# Patient Record
Sex: Male | Born: 1935 | Race: White | Marital: Married | Smoking: Former smoker
Health system: Southern US, Community
[De-identification: ages and names within clinical notes are randomized; demographics above are authoritative.]

## PROBLEM LIST (undated history)

## (undated) DIAGNOSIS — I4892 Unspecified atrial flutter: Secondary | ICD-10-CM

## (undated) DIAGNOSIS — G309 Alzheimer's disease, unspecified: Secondary | ICD-10-CM

## (undated) DIAGNOSIS — F0280 Dementia in other diseases classified elsewhere without behavioral disturbance: Secondary | ICD-10-CM

## (undated) DIAGNOSIS — N189 Chronic kidney disease, unspecified: Secondary | ICD-10-CM

## (undated) DIAGNOSIS — I10 Essential (primary) hypertension: Secondary | ICD-10-CM

## (undated) HISTORY — DX: Essential (primary) hypertension: I10

## (undated) HISTORY — DX: Unspecified atrial flutter: I48.92

## (undated) HISTORY — DX: Chronic kidney disease, unspecified: N18.9

## (undated) HISTORY — DX: Alzheimer's disease, unspecified: G30.9

## (undated) HISTORY — DX: Dementia in other diseases classified elsewhere, unspecified severity, without behavioral disturbance, psychotic disturbance, mood disturbance, and anxiety: F02.80

---

## 2011-03-19 ENCOUNTER — Other Ambulatory Visit: Payer: Self-pay | Admitting: Orthopedic Surgery

## 2011-03-19 ENCOUNTER — Encounter (HOSPITAL_COMMUNITY): Payer: Medicare Other

## 2011-03-19 ENCOUNTER — Ambulatory Visit (HOSPITAL_COMMUNITY)
Admission: RE | Admit: 2011-03-19 | Discharge: 2011-03-19 | Disposition: A | Discharge status: Home / Self Care | Payer: Medicare Other | Source: Ambulatory Visit | Attending: Orthopedic Surgery | Admitting: Orthopedic Surgery

## 2011-03-19 ENCOUNTER — Other Ambulatory Visit (HOSPITAL_COMMUNITY): Payer: Self-pay | Admitting: Orthopedic Surgery

## 2011-03-19 DIAGNOSIS — Z01812 Encounter for preprocedural laboratory examination: Secondary | ICD-10-CM | POA: Insufficient documentation

## 2011-03-19 DIAGNOSIS — Z01818 Encounter for other preprocedural examination: Secondary | ICD-10-CM

## 2011-03-19 DIAGNOSIS — Z01810 Encounter for preprocedural cardiovascular examination: Secondary | ICD-10-CM | POA: Insufficient documentation

## 2011-03-19 LAB — CBC
HCT: 42.5 % (ref 39.0–52.0)
MCH: 31.6 pg (ref 26.0–34.0)
MCHC: 33.6 g/dL (ref 30.0–36.0)
MCV: 93.8 fL (ref 78.0–100.0)
RDW: 12.7 % (ref 11.5–15.5)

## 2011-03-19 LAB — URINALYSIS, ROUTINE W REFLEX MICROSCOPIC
Bilirubin Urine: NEGATIVE
Glucose, UA: >1000 mg/dL — AB
Ketones, ur: NEGATIVE mg/dL
Leukocytes, UA: NEGATIVE
pH: 6 (ref 5.0–8.0)

## 2011-03-19 LAB — BASIC METABOLIC PANEL
BUN: 32 mg/dL — ABNORMAL HIGH (ref 6–23)
Chloride: 102 mEq/L (ref 96–112)
GFR calc Af Amer: 44 mL/min — ABNORMAL LOW (ref >60)
Glucose, Bld: 268 mg/dL — ABNORMAL HIGH (ref 70–99)
Potassium: 5.1 mEq/L (ref 3.5–5.1)
Sodium: 138 mEq/L (ref 135–145)

## 2011-03-19 LAB — DIFFERENTIAL
Basophils Absolute: 0.1 10*3/uL (ref 0.0–0.1)
Basophils Relative: 1 % (ref 0–1)
Eosinophils Relative: 1 % (ref 0–5)
Monocytes Absolute: 0.8 10*3/uL (ref 0.1–1.0)

## 2011-03-19 LAB — SURGICAL PCR SCREEN: MRSA, PCR: NEGATIVE

## 2011-03-21 ENCOUNTER — Ambulatory Visit (INDEPENDENT_AMBULATORY_CARE_PROVIDER_SITE_OTHER): Payer: Medicare Other | Admitting: Cardiovascular Disease

## 2011-03-21 ENCOUNTER — Encounter: Payer: Self-pay | Admitting: Cardiovascular Disease

## 2011-03-21 DIAGNOSIS — I4892 Unspecified atrial flutter: Secondary | ICD-10-CM | POA: Insufficient documentation

## 2011-03-21 DIAGNOSIS — Z01810 Encounter for preprocedural cardiovascular examination: Secondary | ICD-10-CM | POA: Insufficient documentation

## 2011-03-21 NOTE — Assessment & Plan Note (Signed)
The patient is going to have hip replacement done next week. The patient wants this to be done for quality of life and regardless of his cardiovascular risk. This was clear after discussing with him. Obviously, with his age, chronic atrial flutter and bradycardia without any recent cardiac evaluation, he would be considered at moderate risk for cardiovascular complications. I don't see an absolute contraindication to proceed with the surgery nonetheless. The patient has been in atrial flutter since at least 2005 with chronic bradycardia. He has tolerated this and it has not progressed over the last 7 years. Aggressive DVT prophylaxis is recommended postsurgery.

## 2011-03-21 NOTE — Patient Instructions (Signed)
Your physician wants you to follow-up in: 3 months. You will receive a reminder letter in the mail one-two months in advance. If you don't receive a letter, please call our office to schedule the follow-up appointment. Your physician has requested that you have an echocardiogram. Echocardiography is a painless test that uses sound waves to create images of your heart. It provides your doctor with information about the size and shape of your heart and how well your heart's chambers and valves are working. This procedure takes approximately one hour. There are no restrictions for this procedure. DO IN 3 MONTHS BEFORE FOLLOW UP APPOINTMENT WITH DR. Kirke Corin.  Your physician recommends that you continue on your current medications as directed. Please refer to the Current Medication list given to you today.

## 2011-03-21 NOTE — Assessment & Plan Note (Signed)
The patient has chronic atrial flutter with slow ventricular response. I recommend avoiding any medication that can slow the AV conduction such as beta blockers or calcium channel blockers. I recommend an echocardiogram as well as long-term anticoagulation if there is no contraindication. The patient wants to address this after his hip surgery. I will schedule him for followup in 3 months. The patient should continue to be on aspirin 81 mg once daily in the meanwhile.

## 2011-03-21 NOTE — Progress Notes (Signed)
HPI  This is an 77 year old male who is here today for preoperative cardiovascular evaluation for a planned hip replacement next week. The patient was found to have atrial flutter with slow ventricular response and was thus referred to [place] for further evaluation. I looked back in his medical record. The patient was found to be in atrial flutter with 5-1 conduction dating back to 2005 with a ventricular rate of 39 beats per minute at that time. It appears that anticoagulation was discussed with him but he declined. The patient has been asymptomatic from this. He denies any dizziness, syncope or presyncope. According to him his heart rate has always been low. He use to be an avid runner and biker when he was younger. He participated in many marathons. He does not have any previous history of myocardial infarction. His functional capacity was very good up until recently when he started having problems with his hip. He did have an echocardiogram done in 2005 which showed normal LV systolic function and no significant valvular abnormalities. The patient is very hard of hearing and it was difficult to communicate with him.  No Known Allergies   No current outpatient prescriptions on file prior to visit.     Past Medical History  Diagnosis Date  . Hypertension   . CKD (chronic kidney disease)   . Diabetes mellitus   . Atrial flutter      History reviewed. No pertinent past surgical history.   History reviewed. No pertinent family history.   History   Social History  . Marital Status: Married    Spouse Name: N/A    Number of Children: N/A  . Years of Education: N/A   Occupational History  . Not on file.   Social History Main Topics  . Smoking status: Former Smoker    Quit date: 06/23/1970  . Smokeless tobacco: Not on file  . Alcohol Use: Not on file  . Drug Use: Not on file  . Sexually Active: Not on file   Other Topics Concern  . Not on file   Social History Narrative  . No  narrative on file     ROS Constitutional: Negative for fever, chills, diaphoresis, activity change, appetite change and fatigue.  HENT: Negative for  nosebleeds, congestion, sore throat, facial swelling, drooling, trouble swallowing, neck pain, voice change, sinus pressure and tinnitus.  Eyes: Negative for photophobia, pain, discharge and visual disturbance.  Respiratory: Negative for apnea, cough, chest tightness, shortness of breath and wheezing.  Cardiovascular: Negative for chest pain, palpitations and leg swelling.  Gastrointestinal: Negative for nausea, vomiting, abdominal pain, diarrhea, constipation, blood in stool and abdominal distention.  Genitourinary: Negative for dysuria, urgency, frequency, hematuria and decreased urine volume.  Musculoskeletal: Negative for myalgias, joint swelling,  and gait problem.  Skin: Negative for color change, pallor, rash and wound.  Neurological: Negative for dizziness, tremors, seizures, syncope, speech difficulty, weakness, light-headedness, numbness and headaches.  Psychiatric/Behavioral: Negative for suicidal ideas, hallucinations, behavioral problems and agitation. The patient is not nervous/anxious.     PHYSICAL EXAM   BP 159/72  Pulse 50  Ht 5\' 9"  (1.753 m)  Wt 180 lb 1.9 oz (81.702 kg)  BMI 26.60 kg/m2  Constitutional: He is oriented to person, place, and time. He appears well-developed and well-nourished. No distress.  HENT: No nasal discharge.  Head: Normocephalic and atraumatic.  Eyes: Pupils are equal, round, and reactive to light. Right eye exhibits no discharge. Left eye exhibits no discharge.  Neck: Normal range of  motion. Neck supple. No JVD present. No thyromegaly present.  Cardiovascular: Normal rate, irregular rhythm, normal heart sounds and intact distal pulses. Exam reveals no gallop and no friction rub.  No murmur heard.  Pulmonary/Chest: Effort normal and breath sounds normal. No stridor. No respiratory distress. He  has no wheezes. He has no rales. He exhibits no tenderness.  Abdominal: Soft. Bowel sounds are normal. He exhibits no distension. There is no tenderness. There is no rebound and no guarding.  Musculoskeletal: Normal range of motion. He exhibits no edema and no tenderness.  Neurological: He is alert and oriented to person, place, and time. Coordination normal.  Skin: Skin is warm and dry. No rash noted. He is not diaphoretic. No erythema. No pallor.  Psychiatric: He has a normal mood and affect. His behavior is normal. Judgment and thought content normal.       EKG: His recent ECG was reviewed which showed atrial flutter with 5-1 conduction. He does have T wave inversion in the lateral leads suggestive of ischemia.   ASSESSMENT AND PLAN

## 2011-03-25 ENCOUNTER — Inpatient Hospital Stay (HOSPITAL_COMMUNITY): Payer: Medicare Other

## 2011-03-25 ENCOUNTER — Inpatient Hospital Stay (HOSPITAL_COMMUNITY)
Admission: RE | Admit: 2011-03-25 | Discharge: 2011-03-27 | DRG: 470 | Disposition: A | Discharge status: Home Health | Payer: Medicare Other | Source: Ambulatory Visit | Attending: Orthopedic Surgery | Admitting: Orthopedic Surgery

## 2011-03-25 DIAGNOSIS — Z79899 Other long term (current) drug therapy: Secondary | ICD-10-CM

## 2011-03-25 DIAGNOSIS — I4892 Unspecified atrial flutter: Secondary | ICD-10-CM | POA: Diagnosis present

## 2011-03-25 DIAGNOSIS — M169 Osteoarthritis of hip, unspecified: Principal | ICD-10-CM | POA: Diagnosis present

## 2011-03-25 DIAGNOSIS — M1610 Unilateral primary osteoarthritis, unspecified hip: Principal | ICD-10-CM | POA: Diagnosis present

## 2011-03-25 DIAGNOSIS — Z01810 Encounter for preprocedural cardiovascular examination: Secondary | ICD-10-CM

## 2011-03-25 DIAGNOSIS — E119 Type 2 diabetes mellitus without complications: Secondary | ICD-10-CM | POA: Diagnosis present

## 2011-03-25 DIAGNOSIS — I498 Other specified cardiac arrhythmias: Secondary | ICD-10-CM | POA: Diagnosis present

## 2011-03-25 DIAGNOSIS — Z01812 Encounter for preprocedural laboratory examination: Secondary | ICD-10-CM

## 2011-03-25 LAB — TYPE AND SCREEN
ABO/RH(D): A POS
Antibody Screen: NEGATIVE

## 2011-03-25 LAB — GLUCOSE, CAPILLARY
Glucose-Capillary: 156 mg/dL — ABNORMAL HIGH (ref 70–99)
Glucose-Capillary: 197 mg/dL — ABNORMAL HIGH (ref 70–99)
Glucose-Capillary: 194 mg/dL — ABNORMAL HIGH (ref 70–99)

## 2011-03-26 LAB — BASIC METABOLIC PANEL
CO2: 24 mEq/L (ref 19–32)
Chloride: 105 mEq/L (ref 96–112)
GFR calc Af Amer: 45 mL/min — ABNORMAL LOW (ref >90)
Potassium: 4.2 mEq/L (ref 3.5–5.1)
Sodium: 138 mEq/L (ref 135–145)

## 2011-03-26 LAB — CBC
Platelets: 187 10*3/uL (ref 150–400)
RBC: 3.58 MIL/uL — ABNORMAL LOW (ref 4.22–5.81)
RDW: 12.5 % (ref 11.5–15.5)
WBC: 11.3 10*3/uL — ABNORMAL HIGH (ref 4.0–10.5)

## 2011-03-26 LAB — GLUCOSE, CAPILLARY
Glucose-Capillary: 250 mg/dL — ABNORMAL HIGH (ref 70–99)
Glucose-Capillary: 194 mg/dL — ABNORMAL HIGH (ref 70–99)

## 2011-03-27 LAB — GLUCOSE, CAPILLARY: Glucose-Capillary: 217 mg/dL — ABNORMAL HIGH (ref 70–99)

## 2011-03-27 LAB — CBC
MCH: 31.5 pg (ref 26.0–34.0)
MCHC: 33.9 g/dL (ref 30.0–36.0)
RDW: 12.4 % (ref 11.5–15.5)

## 2011-03-27 LAB — BASIC METABOLIC PANEL
BUN: 21 mg/dL (ref 6–23)
Calcium: 8.7 mg/dL (ref 8.4–10.5)
GFR calc Af Amer: 45 mL/min — ABNORMAL LOW (ref >90)
GFR calc non Af Amer: 39 mL/min — ABNORMAL LOW (ref >90)
Glucose, Bld: 158 mg/dL — ABNORMAL HIGH (ref 70–99)
Potassium: 3.6 mEq/L (ref 3.5–5.1)
Sodium: 139 mEq/L (ref 135–145)

## 2011-03-27 NOTE — H&P (Signed)
  NAME:  Eric Lang, NANNINI NO.:  [id number]  MEDICAL RECORD NO.:  [id number]  LOCATION:                                 FACILITY:  PHYSICIAN:  Madlyn Frankel. Charlann Boxer, M.D.  DATE OF BIRTH:  1923-08-29  DATE OF ADMISSION: DATE OF DISCHARGE:                             HISTORY & PHYSICAL   ADMISSION DIAGNOSIS:  Right hip degenerative arthritis.  HISTORY OF PRESENT ILLNESS:  This is an 77 year old gentleman with history of osteoarthritis of his right hip which has failed conservative treatment to alleviate his discomfort and after discussion of treatment, benefits, risks, and options, the patient now scheduled for total hip arthroplasty by anterior approach.  Note that his medical doctor is Dr. Sherril Croon.  He will be going home after surgery.  He does have chronic bradycardia and Dr. Sherril Croon recommended telemetry bed postoperatively to monitor his bradycardia.  He is given his postop medications of aspirin, Robaxin, iron, MiraLax, and Colace to take postoperatively and he is a candidate for tranexamic acid and will receive that in preop.  PAST MEDICAL HISTORY:  Drug allergies none.  CURRENT MEDICATIONS:  Glyburide and metformin 5/500 one daily.  SERIOUS MEDICAL ILLNESSES:  Diabetes.  PREVIOUS SURGERIES:  Cataracts in both eyes and kidney stone surgery.  FAMILY HISTORY:  Positive for death at older age, cause unknown in father and mother.  SOCIAL HISTORY:  The patient is married.  He used to be a Agricultural consultant.  He is an avid runner until 6 years ago when he now walks several miles a day.  He does not smoke and does not drink.  REVIEW OF SYSTEMS:  CENTRAL NERVOUS SYSTEM:  Negative for headache, blurred vision, or dizziness.  Positive for severe hearing loss. PULMONARY:  Negative for shortness breath, PND, or orthopnea. CARDIOVASCULAR:  Positive for chronic bradycardia secondary to his exercise regimen.  GI: Negative for ulcers, hepatitis.  GU: Positive  for nocturia.  MUSCULOSKELETAL:  Positive as in HPI.  PHYSICAL EXAMINATION:  VITAL SIGNS:  BP 140/82, respirations 12, and pulse 48 and regular. GENERAL APPEARANCE:  This is a well-nourished and well-nourished gentleman, in no acute distress. HEENT:  Head normocephalic.  Nose patent.  Ears with a hearing aid in the left ear.  The patient is nearly deaf. NECK:  Supple without adenopathy.  Throat without injection.  Carotids 2+ without bruit.  Chest: Clear to auscultation.  No rales or rhonchi. Respirations 12 HEART:  Regular rate and rhythm at 48 beats without murmur. ABDOMEN:  Soft with active bowel sounds.  No masses, organomegaly. Neurologic: Patient alert and oriented to time, place and person. Cranial nerves II-XII grossly intact.  EXTREMITIES:  Shows the right hip with decreased range of motion with pain.  Neurovascular status intact.  IMPRESSION:  His right hip osteoarthritis.  PLAN:  Total hip arthroplasty by anterior approach, right hip.     Jaquelyn Bitter. Chabon, P.A.   ______________________________ Madlyn Frankel Charlann Boxer, M.D.    SJC/MEDQ  D:  03/19/2011  T:  03/19/2011  Job:  [id number]  Electronically Signed by Jodene Nam P.A. on 03/24/2011 12:59:47 PM Electronically Signed by Durene Romans M.D. on 03/27/2011 08:17:22 AM

## 2011-03-31 NOTE — Op Note (Signed)
NAMEMarland Lang  DEVRON, COHICK NO.:  [id number]  MEDICAL RECORD NO.:  [id number]  LOCATION:  1408                         FACILITY:  [facility]  PHYSICIAN:  Madlyn Frankel. Charlann Boxer, M.D.  DATE OF BIRTH:  12/09/1923  DATE OF PROCEDURE:  03/25/2011 DATE OF DISCHARGE:                              OPERATIVE REPORT   PREOPERATIVE DIAGNOSIS:  Advanced right hip osteoarthritis.  POSTOPERATIVE DIAGNOSIS:  Advanced right hip osteoarthritis.  PROCEDURE:  Right total hip replacement through an anterior approach utilizing DePuy component size 58, Pinnacle cup 36 +4, neutral AltrX liner, size 4 standard Tri-Lock stem with 36 +1.5 metal A sphere ball.  SURGEON:  Madlyn Frankel. Charlann Boxer, M.D.  ASSISTANT:  Lanney Gins, PA-C.  Please note that Lanney Gins, physician assistant, was present for the entirety of the case from preoperative position to perioperative retractor management, general facilitation of case as well as primary wound closure at the end of case.  ANESTHESIA:  General.  SPECIMENS:  None.  COMPLICATIONS:  None.  DRAINS:  One Hemovac.  BLOOD LOSS:  About 450 cc.  INDICATIONS FOR PROCEDURE:  Eric Lang is a pleasant 77 year old gentleman who had presented to office for evaluation of right hip pain. Radiographs revealed end-stage degenerative changes.  He had reported significant loss in his functional quality of life.  Despite being 55, he is very active and relatively healthy.  He wished to remain active in terms of walking as this is a very important part of his life.  After reviewing risks and benefits of the procedure, he wished to proceed with an anterior approach.  Specific risk of infection, DVT, component failure, need for revision surgery, all discussed and reviewed as well as the approach and risks of dislocation.  Consent obtained.  PROCEDURE IN DETAIL:  The patient was brought to operative theater. Once adequate anesthesia, preoperative antibiotics, Ancef  administered, the patient was positioned supine on the OSI Hana table.  Once bony prominences were padded and after adequately positioning of the perineal post, his right arm was flexed over his body.  We pre-draped out the hip and used fluoroscopy to confirm orientation of the pelvis and rule out its positioning.  The right hip was then prepped and draped in sterile fashion with a shower curtain technique over the anterior aspect of the right hip. Time-out was performed identifying the patient, planned procedure, and extremity.  An incision was then made 2 cm distal and lateral to the anterior superior iliac spine and sharp dissection carried to the fascia of the tensor fascia lata muscle.  The fascia was then incised and the muscle belly swept laterally and retractor placed along the superior neck.  Following cauterization of the circumflex vessels and removing the pericapsular fat, retractors were placed inferiorly along the neck.  Retractor was then placed over the anterior acetabulum underneath the rectus and rectus femoris.  Capsulotomy was then made along the superior neck to the trochanteric fossa and along the inferior neck region.  Tag sutures were placed and retractor placed intracapsular.  Under fluoroscopic imaging, the confirmation of orientation of the neck cut was made and that was identified.  A neck osteotomy was made and  femoral head removed without difficulty.  Traction was utilized but removed at this point.  Retractors were placed anterior and superior labrum and soft tissue debrided.  I began reaming and reamed up to 57 reamer where I got a good bony bed preparation circumferentially.  Given this reaming, a 58 cup was chosen and impacted with a good secure rim fit.  Based on the initial fixation, no screws were utilized.  Hole eliminator was placed in the 36 +4 neutral site.  AltrX liner was positioned.  At this point, the retractors were removed and  attention was now directed to the femoral preparation.  The lateral femoral hook was placed and the femur elevated and held in position with a hook.  The hip was then extended and abducted and the posterior soft tissue debrided and a retractor placed posterior and medial retractor placed and the proximal femur was then opened, starting box osteotome, starting broach and then broached up to a size 4 broach.  Radiographic confirmation and orientation of stem in AP and lateral planes was carried out.  Trial reduction was carried out of standard neck.  At this point, I felt leg lengths were equal and hip stability was excellent. Given these findings, trial components removed and final 4 standard Tri- Lock stem was chosen.  It was then impacted down at the level of the neck cut.  Final 36 +1.5 metal A sphere ball was chosen and impacted onto clean and dry trunnion and the hip reduced.  The hip was cleaned, dried, and dressed sterilely, and irrigated throughout the case.  I reapproximated the anterior capsule using #1 Vicryl.  The fascia of tensor fascia lata muscle was then reapproximated using #1 Vicryl as well.  The hip was then cleaned, dried, and dressed sterilely using Dermabond and Aquacel dressing, drain site dressed separately.  The patient was then brought to recovery room in stable condition tolerating the procedure well.     Madlyn Frankel Charlann Boxer, M.D.     MDO/MEDQ  D:  03/27/2011  T:  03/27/2011  Job:  [id number]  Electronically Signed by Durene Romans M.D. on 03/31/2011 01:29:48 PM

## 2011-04-14 NOTE — Discharge Summary (Signed)
  NAMEMarland Kitchen  BROOKS, KINNAN NO.:  [id number]  MEDICAL RECORD NO.:  [id number]  LOCATION:  1408                         FACILITY:  [facility]  PHYSICIAN:  Madlyn Frankel. Charlann Boxer, M.D.  DATE OF BIRTH:  Dec 14, 1923  DATE OF ADMISSION:  03/25/2011 DATE OF DISCHARGE:  03/27/2011                              DISCHARGE SUMMARY   PROCEDURES:  Right total hip arthroplasty.  ADMITTING DIAGNOSIS:  Right hip degenerative arthritis.  DISCHARGE DIAGNOSES: 1. Status post right total hip arthroplasty. 2. Diabetes.  HISTORY OF PRESENT ILLNESS:  The patient is an 77 year old gentleman with a history of osteoarthritis of his right hip.  The patient has tried several conservative treatments, all of which have failed to alleviate his discomfort.  X-rays in the clinic did show degenerative arthritic changes of the right hip.  Various options were discussed with the patient.  The patient wished to proceed with surgery.  Risks, benefits, and expectations of the procedure discussed with the patient. The patient understands the risks, benefits, and expectations and wished to proceed with surgery.  HOSPITAL COURSE:  The patient underwent the above-stated procedure on March 25, 2011.  The patient tolerated the procedure well, was brought to the recovery room in good condition and subsequently to the floor.  Postop day #1, March 26, 2011, the patient is doing well.  No events. Afebrile.  Vital signs stable.  Hemovac was taken out.  He was distally neurovascularly intact.  Hematocrit was 33.6.  Postop day #2, March 27, 2011, the patient is doing well.  No events. Hematocrit is 33.0.  Right hip was clean, dry, and intact.  The patient had physical therapy x2.  The patient was felt to be doing well enough to be discharged home with home health PT after having physical therapy within the hospital.  DISCHARGE CONDITION:  Good.  DISCHARGE INSTRUCTIONS:  The patient will be discharged home with  home health PT after having physical therapy in the hospital.  The patient will be weightbearing as tolerated.  The patient will maintain surgical dressing for about 8 days, after which time we will replace with gauze and tape.  The patient should keep the area dry and clean until followup.  The patient will follow up with [facility] in 2 weeks.  The patient is to call with any questions or concerns.  DISCHARGE MEDICATIONS: 1. Aspirin, enteric coated, 325 mg 1 p.o. b.i.d. for 4 weeks. 2. Tylenol 325 mg 1-2 p.o. q.4-6 h. p.r.n. pain. 3. Benadryl 25 mg 1 p.o. q.4 h. p.r.n. 4. Colace 100 mg 1 p.o. b.i.d. 5. Iron sulfate 325 mg 1 p.o. t.i.d. for 2-3 weeks. 6. MiraLax 17 g p.o. daily for constipation. 7. Robaxin 500 mg 1 p.o. q.6 h. p.r.n. muscle spasm. 8. Glyburide/metformin 5/500 mg 1 p.o. q.a.m.    ______________________________ Lanney Gins, PA   ______________________________ Madlyn Frankel. Charlann Boxer, M.D.    MB/MEDQ  D:  03/27/2011  T:  03/27/2011  Job:  [id number]  Electronically Signed by Lanney Gins PA on 04/08/2011 08:29:10 AM Electronically Signed by Durene Romans M.D. on 04/14/2011 12:28:18 PM

## 2013-07-08 IMAGING — CR DG PELVIS 1-2V
1 series · 1 of 1 positions shown · non-contrast
Comparison: None.

CLINICAL DATA: Postop

PELVIS - 1-2 VIEW

[AP]
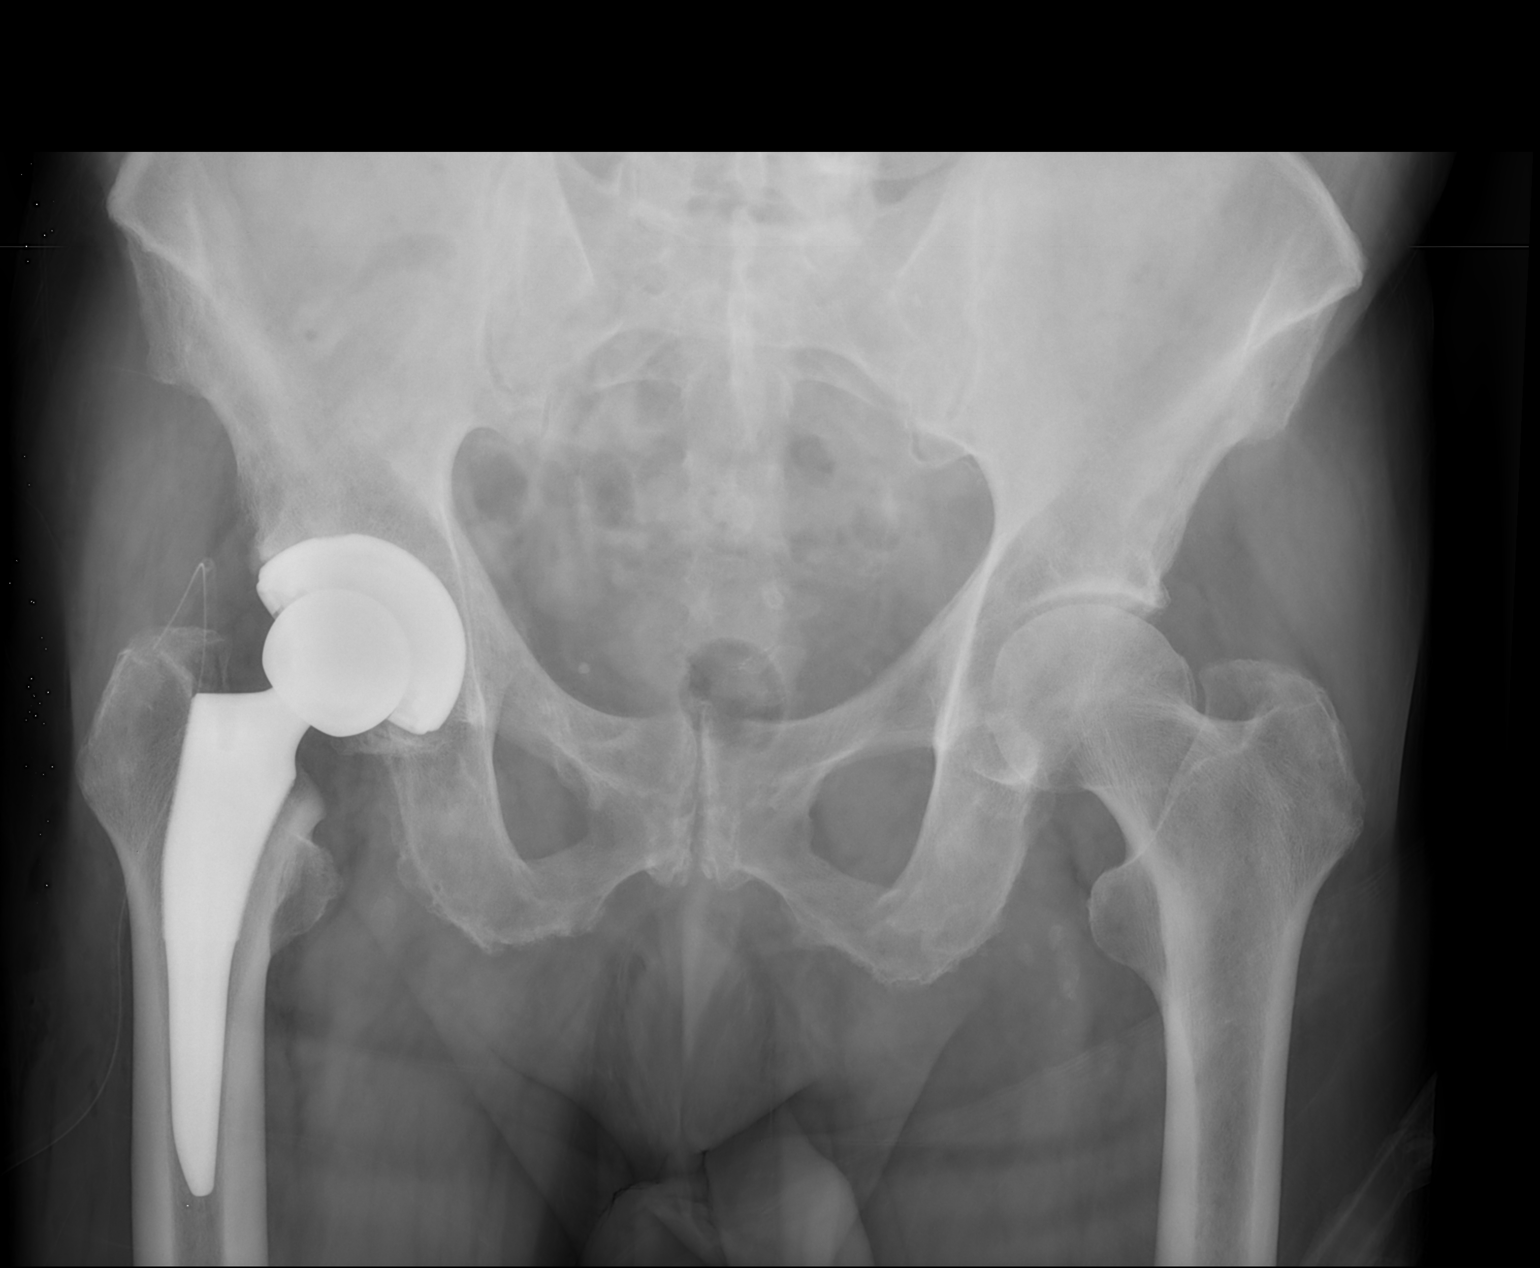

[1 of 1 positions shown; findings below may reference images not displayed]

FINDINGS: Right total hip replacement.  Satisfactory position and
alignment. Surgical drain good position.
IMPRESSION: As above.

## 2016-08-07 ENCOUNTER — Encounter (HOSPITAL_COMMUNITY): Payer: Medicare Other | Attending: Oncology | Admitting: Oncology

## 2016-08-07 ENCOUNTER — Encounter (HOSPITAL_COMMUNITY): Payer: Self-pay

## 2016-08-07 ENCOUNTER — Encounter (HOSPITAL_COMMUNITY): Payer: Self-pay | Admitting: Lab

## 2016-08-07 DIAGNOSIS — I10 Essential (primary) hypertension: Secondary | ICD-10-CM

## 2016-08-07 DIAGNOSIS — C7951 Secondary malignant neoplasm of bone: Secondary | ICD-10-CM | POA: Diagnosis not present

## 2016-08-07 DIAGNOSIS — E119 Type 2 diabetes mellitus without complications: Secondary | ICD-10-CM | POA: Diagnosis not present

## 2016-08-07 DIAGNOSIS — N189 Chronic kidney disease, unspecified: Secondary | ICD-10-CM

## 2016-08-07 DIAGNOSIS — C801 Malignant (primary) neoplasm, unspecified: Secondary | ICD-10-CM | POA: Insufficient documentation

## 2016-08-07 NOTE — Progress Notes (Unsigned)
Referral sent to [facility] .  Faxed records to [phone].  Phone number there is [phone], spoke with Jackelyn Poling

## 2016-08-07 NOTE — Progress Notes (Signed)
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  HISTORY OF PRESENTING ILLNESS:  Eric Lang 83 y.o. male is here because of referral by [facility] for weight loss and occult malignancy evaluation.   As per Dr. Woody Lang' note on 07/18/2016" " Elderly Caucasian male who had decreased appetite, nausea, had acute on chronic renal failure. Patient has also significant weight loss and has anemia. Appears to be occult malignancy.   Patient has elevated proBNP, acute on chronic renal failure, dementia, extremely hard of hearing.   CT scan of the chest was ordered. Also abdomen/pelvis was done, which showed bone diffuse sclerosis. Patient was being seen by nephrologist during the hospital stay. Multiple blood tests were done for kidney disease, multiple myeloma evaluation, and urine studies being done.    It appears that patient may have occult malignancy which requires further evaluation. He has lost about 50 pounds over the last 4 months."  Patient initially presented to [facility] on 07/18/16 for anemia with a hemoglobin 7.8 g/dL. He is given 2 units PRBC transfusion. His anemia was suspected to be possibly secondary to occult malignancy.   Workup for possible malignancy ensued with:  CT abdomen and pelvis without contrast on 07/18/16 demonstrated diffuse heterogeneous sclerosis throughout the osseous structures. Metastatic disease cannot be excluded. Alternatively a systemic process might have a similar appearance, though the sclerosis is more heterogeneous than expected for renal osteodystrophy. Scattered coronary artery calcifications. Trace bilateral pleural effusions. Scatter aortic atherosclerosis. Scattered diverticulosis along the sigmoid colon without evidence of diverticulitis. Bilateral renal cysts.  CT chest  without contrast 07/19/16 demonstrated small bilateral pleural effusion. 1.1 similar subcarinal lymph node likely reactive. No hilar adenopathy. Bilateral retrocrural adenopathy at the level of the diaphragm likely due to metastatic disease.  Bone scan 07/21/16 demonstrated diffuse bony sclerosis especially in the axial skeleton. No compression fracture or lytic lesions observed. Multilevel degenerative disc disease of the spine.  Alkaline phosphatase 07/12/16 was 1112. ST/ALP/full bilirubin were within normal limits. Albumin 3.3. Creatinine 2.41. Hemoglobin 8.6, hematocrit 27.2%, MCV 95.8.  Urine SPEP 07/19/16 was negative for an M spike. PSA ordered on 07/12/16 was pending.    Eric Lang presents today accompanied by his son for consultation and occult malignancy evaluation. Patient presents in a wheelchair and is very hard of hearing and somnolent. I personally reviewed and went over labs and scans with the patient's son. His current nursing home is in [place]. The son is planning to get him transferred to [facility].  Patient's son Eric Lang states that patient's overall performance status is very poor. He has been having a progressive decline and spends most of his day sleeping.     MEDICAL HISTORY:  Past Medical History:  Diagnosis Date  . Atrial flutter   . CKD (chronic kidney disease)   . Diabetes mellitus   . Hypertension     SURGICAL HISTORY: No past surgical history on file.  SOCIAL HISTORY: Social History   Social History  . Marital status: Married    Spouse name: N/A  . Number of children: N/A  . Years of education: N/A   Occupational History  . Not on file.   Social History Main Topics  . Smoking status: Former Smoker    Quit date: 06/23/1970  . Smokeless tobacco: Not on file  . Alcohol use  Not on file  . Drug use: Unknown  . Sexual activity: Not on file   Other Topics Concern  . Not on file   Social History Narrative  . No narrative on file     FAMILY HISTORY: No family history on file.  ALLERGIES:  has No Known Allergies.  MEDICATIONS:  Current Outpatient Prescriptions  Medication Sig Dispense Refill  . acetaminophen (TYLENOL) 325 MG tablet Take 650 mg by mouth every 6 (six) hours as needed for mild pain or moderate pain.    Marland Kitchen acetaminophen (TYLENOL) 650 MG suppository Place 650 mg rectally every 4 (four) hours as needed.    . calcium-vitamin D (OSCAL WITH D) 500-200 MG-UNIT tablet Take 1 tablet by mouth 2 (two) times daily.    . Cholecalciferol (VITAMIN D-3) 1000 units CAPS Take 1 capsule by mouth daily.    . colchicine 0.6 MG tablet Take 0.6 mg by mouth daily.    . finasteride (PROSCAR) 5 MG tablet Take 5 mg by mouth daily.    Marland Kitchen glipiZIDE (GLUCOTROL) 5 MG tablet Take 5 mg by mouth daily before breakfast.    . haloperidol (HALDOL) 1 MG tablet Take 1 mg by mouth at bedtime.    . Insulin Lispro (HUMALOG [place]) Inject 2-8 Units into the skin.    Marland Kitchen insulin lispro (HUMALOG) 100 UNIT/ML injection Inject into the skin 3 (three) times daily before meals.    . Multiple Vitamin (MULTIVITAMIN) tablet Take 1 tablet by mouth daily.    . Nutritional Supplements (BOOST GLUCOSE CONTROL) LIQD Take 1 Can by mouth 2 (two) times daily.    . ondansetron (ZOFRAN) 4 MG tablet Take 4 mg by mouth every 6 (six) hours as needed for nausea or vomiting.    . risperiDONE (RISPERDAL) 0.5 MG tablet Take 0.5 mg by mouth 2 (two) times daily as needed.     No current facility-administered medications for this visit.     Review of Systems  Constitutional: Negative.        Unable to obtain review of systems since patient is very somnolent and hard of hearing.  HENT: Negative.   Eyes: Negative.   Respiratory: Negative.   Cardiovascular: Negative.   Gastrointestinal: Negative.   Genitourinary: Negative.   Musculoskeletal: Negative.   Skin: Negative.   Neurological: Negative.   Endo/Heme/Allergies: Negative.   Psychiatric/Behavioral: Negative.   All  other systems reviewed and are negative.  14 point ROS was done and is otherwise as detailed above or in HPI   PHYSICAL EXAMINATION: ECOG PERFORMANCE STATUS: 3 - Symptomatic, >50% confined to bed  Vitals:   08/07/16 1554  BP: (!) 153/39  Pulse: (!) 50  Resp: 20  Temp: 97.9 F (36.6 C)   There were no vitals filed for this visit.   Physical Exam  Constitutional: No distress.  Physical exam was done in a wheelchair. Cachectic elderly male, sleeping.  HENT:  Head: Normocephalic and atraumatic.  Eyes: Conjunctivae and EOM are normal. Pupils are equal, round, and reactive to light. No scleral icterus.  Neck: Normal range of motion. Neck supple.  Cardiovascular: Normal rate, regular rhythm and normal heart sounds.   Pulmonary/Chest: Effort normal and breath sounds normal. No respiratory distress. He has no wheezes.  Abdominal: Soft. Bowel sounds are normal. He exhibits no distension. There is no tenderness.  Musculoskeletal: Normal range of motion. He exhibits no edema.  Lymphadenopathy:    He has no cervical adenopathy.  Neurological:  Unable to assess, patient sleeping  Skin:  Skin is warm and dry. No rash noted.  Nursing note and vitals reviewed.     LABORATORY DATA:  I have reviewed the data as listed Lab Results  Component Value Date   WBC 11.7 (H) 03/27/2011   HGB 11.2 (L) 03/27/2011   HCT 33.0 (L) 03/27/2011   MCV 92.7 03/27/2011   PLT 172 03/27/2011   CMP     Component Value Date/Time   NA 139 03/27/2011 0435   K 3.6 03/27/2011 0435   CL 107 03/27/2011 0435   CO2 24 03/27/2011 0435   GLUCOSE 158 (H) 03/27/2011 0435   BUN 21 03/27/2011 0435   CREATININE 1.55 (H) 03/27/2011 0435   CALCIUM 8.7 03/27/2011 0435   GFRNONAA 39 (L) 03/27/2011 0435   GFRAA 45 (L) 03/27/2011 0435     RADIOGRAPHIC STUDIES: I have personally reviewed the radiological images as listed and agreed with the findings in the report. No results found.  CT ABDOMEN AND PELVIS WITHOUT  CONTRAST 07/18/2016  IMPRESSION:  1. Diffuse heterogeneous sclerosis throughout the visualized osseous structures. Metastatic disease cannot be excluded. Alternatively, a systemic process might have a similar appearance, thought the sclerosis is more heterogeneous than expected for renal osteodystrophy. Would correlate with the patient's labs and clinical history; further evaluation would be helpful as deemed clinically appropriate.  2. Scattered coronary artery calcifications. 3. Trace bilateral pleural effusions. 4. Scattered aortic atherosclerosis.  5. Scattered diverticulosis along the sigmoid colon, without evidence of diverticulitis.  6. Bilateral renal cysts. Mild scarring at the lower poles of both kidneys.   NUCLEAR MEDICINE VENTILATION - PERFUSION LUNG SCAN 07/19/2016  IMPRESSION:  No evidence of pulmonary embolus.    CT CHEST WITHOUT CONTRAST 07/19/2016  IMPRESSION: Small bialateral pleural effusions. Focal airspace process or pulmonary nodule/mass. 1.1 cm subcarinal lymph node likely reactive.   No hilar adenopathy. Bilateral retrocrural adenopathy at the level of the diaphragm likely due to metastatic disease.   Diffuse sclerosis throughout the axial and appendicular skeleton. This is a new finding since 2014. Findings likely due to diffuse metastatic disease, although diffuse metabolic process is not excluded.   Mild cardiomegaly and left main and 3 vessel atherosclerotic coronary artery disease. Aortic atherosclerosis.   Left renal cyst.    METASTATIC BONE SURVEY 07/21/2016  IMPRESSION:  Diffuse bony scleroses especially in the axial skeleton. No compression fracture nor lytic lesions observed. Multilevel degenerative disc disease of the spine.  Thoracic and abdominal aortic atherosclerosis. Mild cardiomegaly without significant pulmonary vascular congestion.    ASSESSMENT & PLAN:  Unfortunately patient was not able to participate in the discussion regarding his  care since he is very hard appearing and somnolent.   I have reviewed patient's malignancy workup including his scans and lab work in detail with his son Eric Lang who is his power of attorney. I have told Eric Lang that there is the possibility that patient has underlying malignancy, possibly from his prostate, however it is difficult to know for sure unless we initiate further workup including getting his PSA results and possible prostate biopsy if it is elevated. Patient's overall performance status is very poor, at least 3, and he's had a major decline including weight loss over the past few months. He also has multiple comorbidities. His son is in agreement that most likely the patient will not be able to tolerate any form of treatment, therefore I will not even initiate a formal workup for suspected malignancy with bone metastases, since there will be no plans for treatment. I  have discussed comfort measures, and the son is in agreement with referral for hospice. Patient is already DO NOT RESUSCITATE.  I have referred him to hospice care.  We'll turnover complete care to them. Return to clinic when necessary.   All questions were answered. The patient knows to call the clinic with any problems, questions or concerns.  This document serves as a record of services personally performed by Twana First, MD. It was created on her behalf by Shirlean Mylar, a trained medical scribe. The creation of this record is based on the scribe's personal observations and the provider's statements to them. This document has been checked and approved by the attending provider.  I have reviewed the above documentation for accuracy and completeness and I agree with the above.  This note was electronically signed.    [employer]  08/07/2016 3:57 PM

## 2016-08-07 NOTE — Patient Instructions (Signed)
[place] at [facility] Discharge Instructions  RECOMMENDATIONS MADE BY THE CONSULTANT AND ANY TEST RESULTS WILL BE SENT TO YOUR REFERRING PHYSICIAN.  You were seen today by Dr. Barron Schmid We will get you referred to Hospice, they will be in contact with you Follow up here at clinic as needed We will also get our social worker, Loren Racer, SW to contact you regarding concerns discussed today See Amy up front for appointments   Thank you for choosing [place] at [facility] to provide your oncology and hematology care.  To afford each patient quality time with our provider, please arrive at least 15 minutes before your scheduled appointment time.    If you have a lab appointment with the [facility] please come in thru the  Main Entrance and check in at the main information desk  You need to re-schedule your appointment should you arrive 10 or more minutes late.  We strive to give you quality time with our providers, and arriving late affects you and other patients whose appointments are after yours.  Also, if you no show three or more times for appointments you may be dismissed from the clinic at the providers discretion.     Again, thank you for choosing [facility].  Our hope is that these requests will decrease the amount of time that you wait before being seen by our physicians.       _____________________________________________________________  Should you have questions after your visit to [facility], please contact our office at [phone] between the hours of 8:30 a.m. and 4:30 p.m.  Voicemails left after 4:30 p.m. will not be returned until the following business day.  For prescription refill requests, have your pharmacy contact our office.       Resources For Cancer Patients and their Caregivers ? American Cancer Society: Can assist with transportation, wigs, general needs, runs Look Good Feel  Better.        778-407-0480 ? Cancer Care: Provides financial assistance, online support groups, medication/co-pay assistance.  1-800-813-HOPE (803)230-8781) ? [place] Assists [place] cancer patients and their families through emotional , educational and financial support.  9896337955 ? [place] DSS Where to apply for food stamps, Medicaid and utility assistance. (954)853-1861 ? [facility]: Transportation to medical appointments. (607)541-3863 ? Social Security Administration: May apply for disability if have a Stage IV cancer. 410-810-0824 (662) 859-4625 ? [employer], Disability and Transit Services: Assists with nutrition, care and transit needs. [place] Support Programs: @10RELATIVEDAYS @ > Cancer Support Group  2nd Tuesday of the month 1pm-2pm, Journey Room  > Creative Journey  3rd Tuesday of the month 1130am-1pm, Journey Room  > Look Good Feel Better  1st Wednesday of the month 10am-12 noon, Journey Room (Call [facility] to register [phone])

## 2016-08-08 ENCOUNTER — Telehealth (HOSPITAL_COMMUNITY): Payer: Self-pay | Admitting: *Deleted

## 2016-08-08 NOTE — Telephone Encounter (Signed)
Eric Lang called back and wanted to know if the doctor had given any time frame for patient. Does ha have months or weeks left. Explained to patient that the MD did not specify in her note. Family is wanting to move patient into "memory care home" with his wife. They did not want to put him there then have to turn around and move him again. She states hospice is going to see him there. Explained that hospice can control his pain and hopefully he will not have to move anymore. She verbalized understanding.

## 2016-08-08 NOTE — Telephone Encounter (Signed)
Attempted to call Anderson Malta at both numbers in chart. Unable to reach. Left message on [facility] cell.

## 2016-08-18 ENCOUNTER — Telehealth (HOSPITAL_COMMUNITY): Payer: Self-pay

## 2016-08-18 NOTE — Telephone Encounter (Signed)
Patients granddaughter called stating patient is extremely weak and hospice had not been ordered yet per [facility]. Hospice had originally been ordered for [facility] but patient is not able to go. Called nursing home and spoke with Harvel Quale, LPN , patients nurse. She states hospice has been ordered and they were out there Friday. Also called and spoke with Olean Ree at [facility] of [facility]. She states patient can be admitted to Hospice as soon as [facility] stops billing Medicare. They are billing medicare for PT.  Olean Ree and I agree that the patient is not able to have therapy. He is not strong enough and is for palliative and comfort care only. Explained this to the granddaughter also. She states she will call nursing home to get therapy stopped so Hospice can come in and take care of him.

## 2016-08-21 DEATH — deceased
